# Patient Record
Sex: Male | Born: 1950 | Race: White | Hispanic: No
Health system: Southern US, Community
[De-identification: ages and names within clinical notes are randomized; demographics above are authoritative.]

## PROBLEM LIST (undated history)

## (undated) DIAGNOSIS — C3490 Malignant neoplasm of unspecified part of unspecified bronchus or lung: Secondary | ICD-10-CM

---

## 2006-07-03 ENCOUNTER — Ambulatory Visit: Payer: Self-pay | Admitting: Emergency Medicine

## 2006-07-04 ENCOUNTER — Ambulatory Visit: Payer: Self-pay | Admitting: Emergency Medicine

## 2006-12-23 ENCOUNTER — Ambulatory Visit: Payer: Self-pay | Admitting: Internal Medicine

## 2008-04-23 IMAGING — CT CT HEAD WITHOUT CONTRAST
1 series · 16 of 30 positions shown, 20 images · non-contrast
Comparison: none

REASON FOR EXAM: New Onset HA Worsening
COMMENTS:

PROCEDURE:     TANEYA - ASHE COVA WITHOUT CONTRAST  - July 04, 2006  [DATE]
RESULT:
HISTORY: New onset headache.
COMPARISON STUDIES: No recent.
PROCEDURE AND FINDINGS: No intra-axial or extra-axial pathologic fluid or
blood collections identified.  No mass lesion is noted. There is no
hydrocephalus.  No acute bony abnormalities identified.

[Series 2: soft tissue · axial · 0.43mm/px · z∈[-184,-44]mm · 16 of 32 slices shown, 20 images]
[im 2/32  brain]
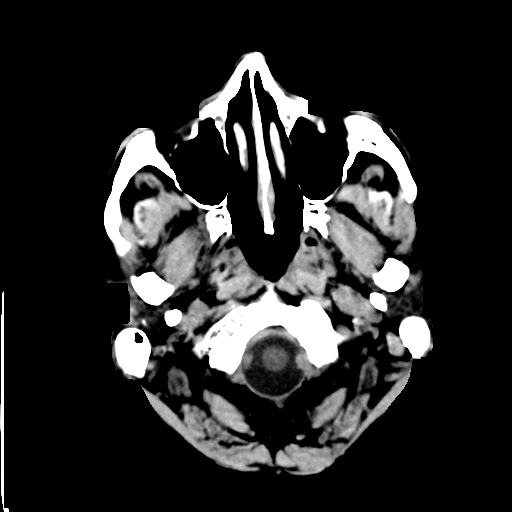
[im 2/32  bone]
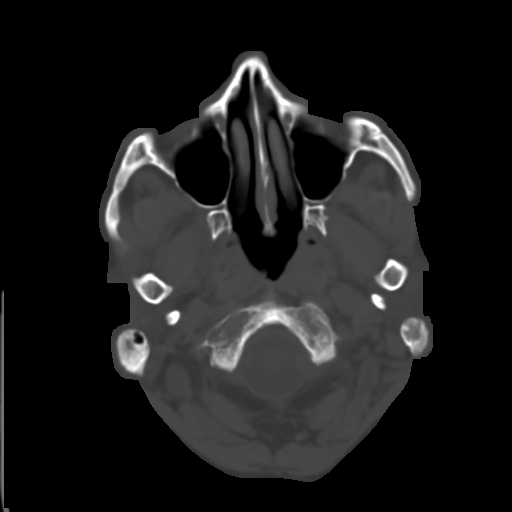
[im 4/32  brain]
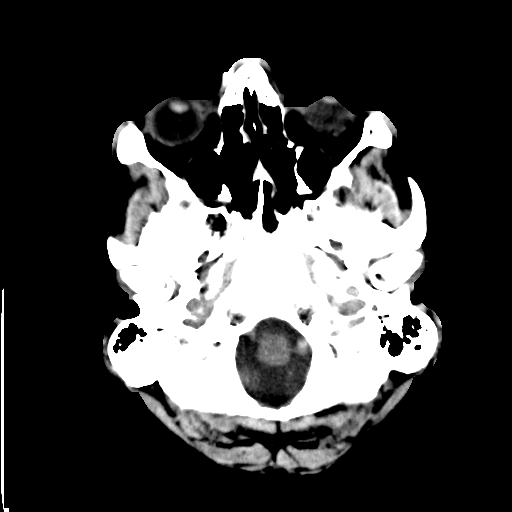
[im 6/32  brain]
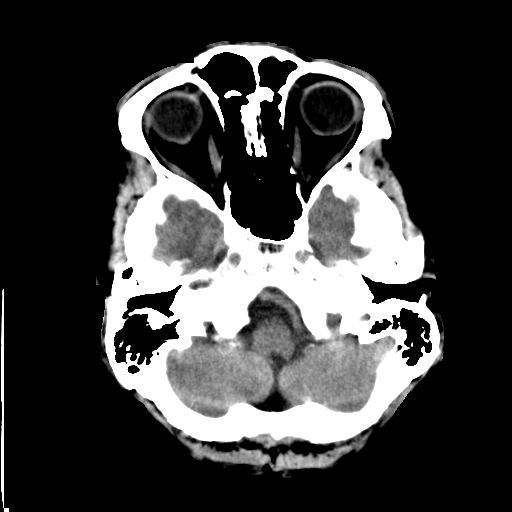
[im 8/32  brain]
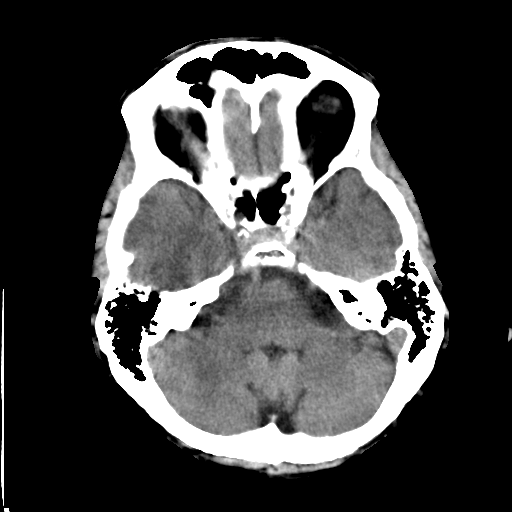
[im 9/32  brain]
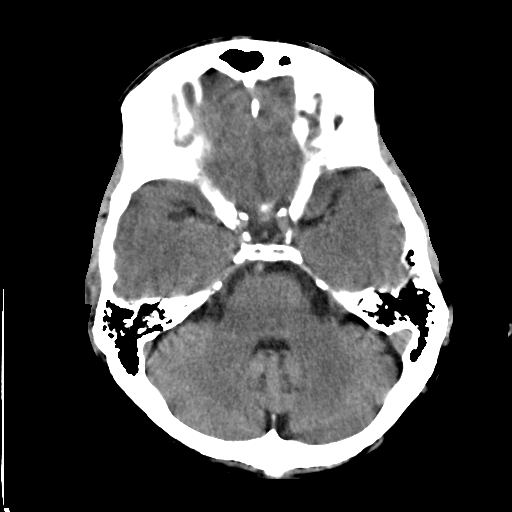
[im 9/32  bone]
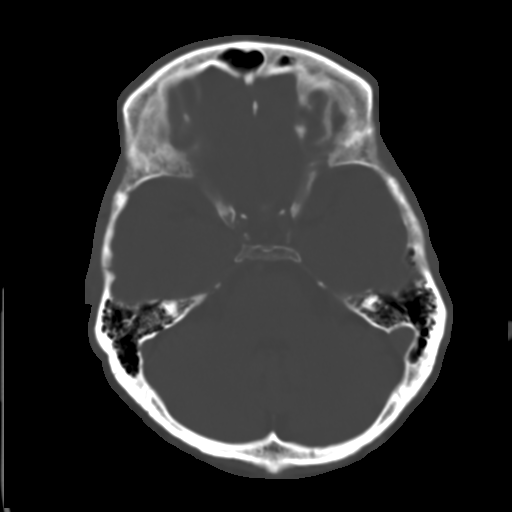
[im 11/32  brain]
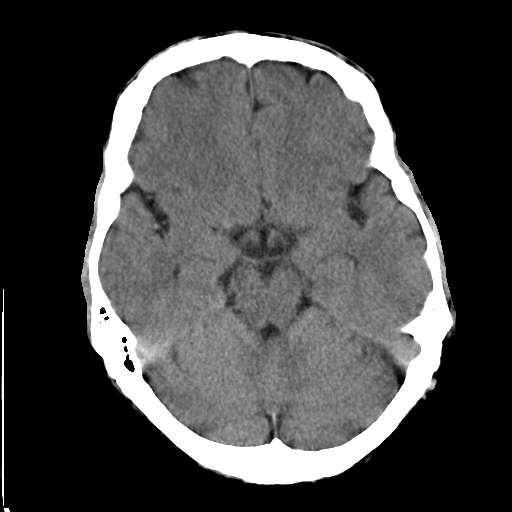
[im 13/32  brain]
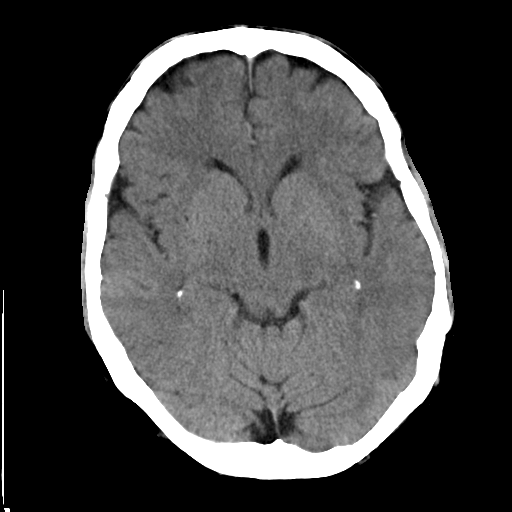
[im 15/32  brain]
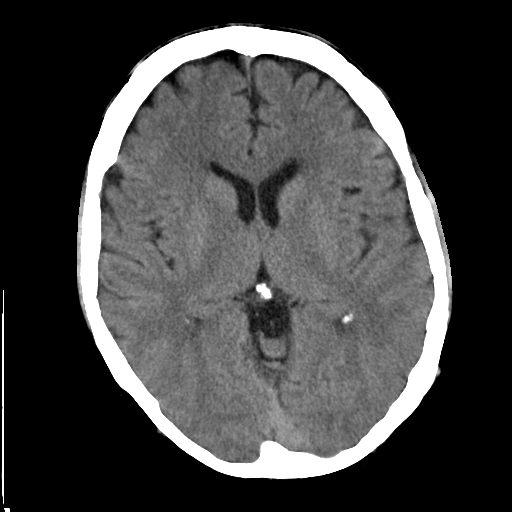
[im 17/32  brain]
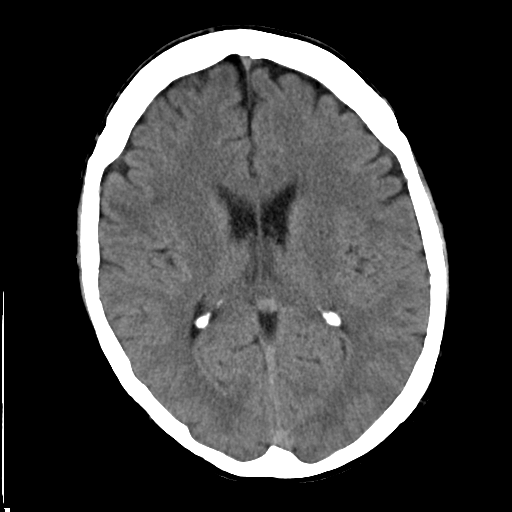
[im 17/32  bone]
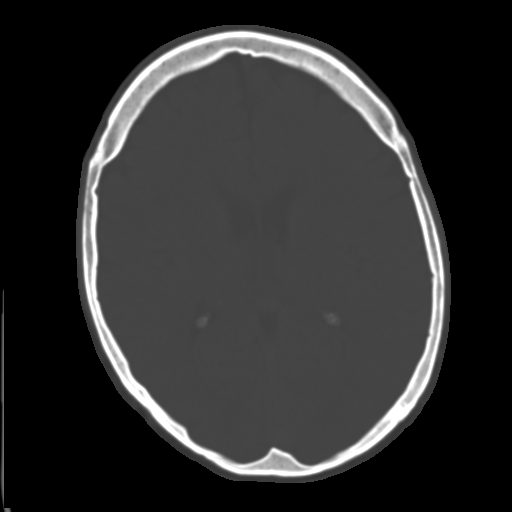
[im 19/32  brain]
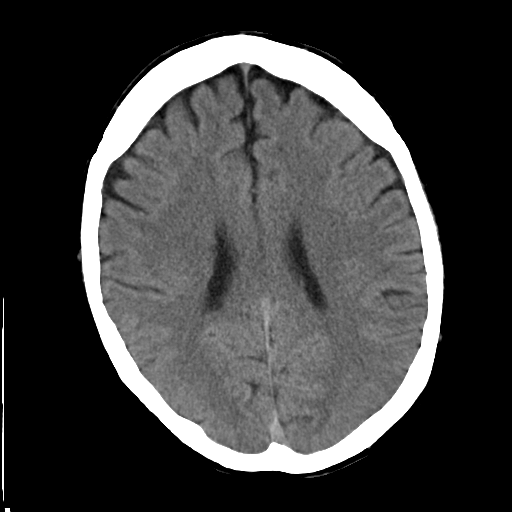
[im 21/32  brain]
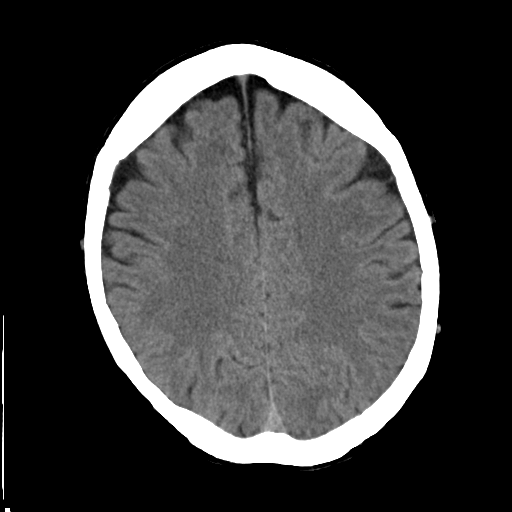
[im 23/32  brain]
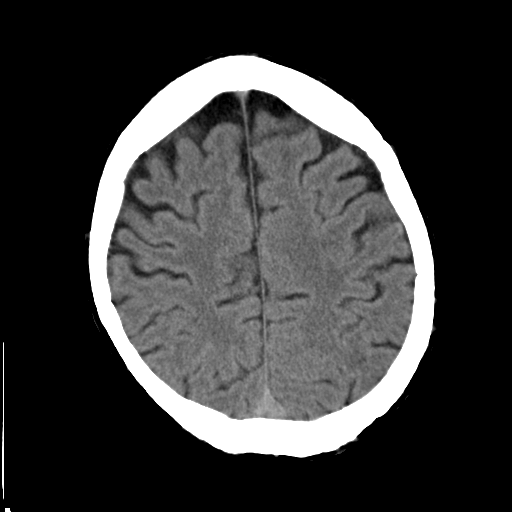
[im 24/32  brain]
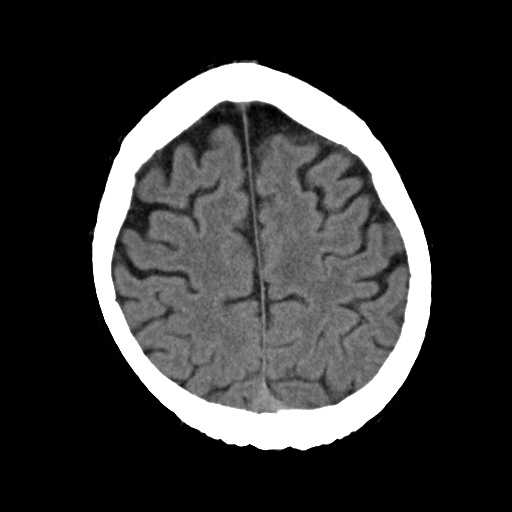
[im 24/32  bone]
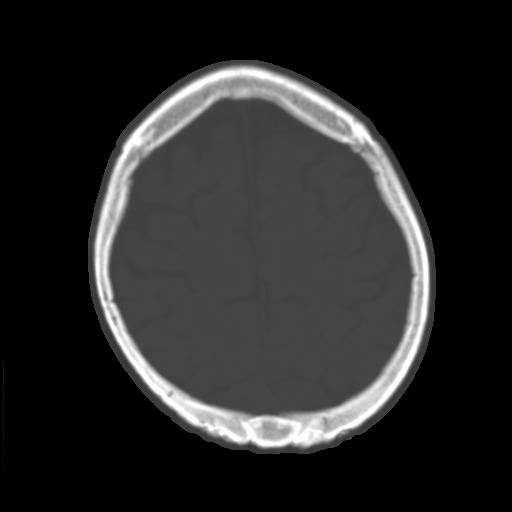
[im 26/32  brain]
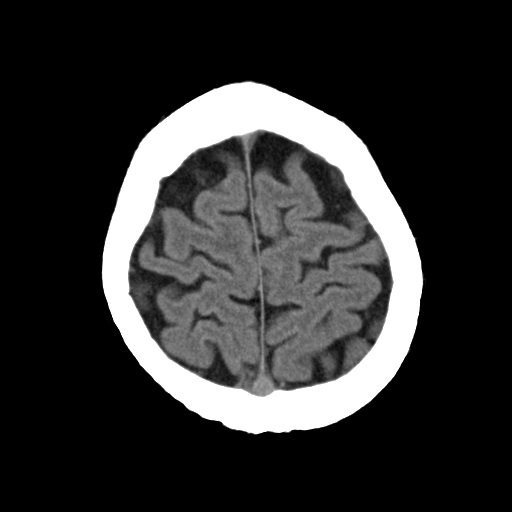
[im 28/32  brain]
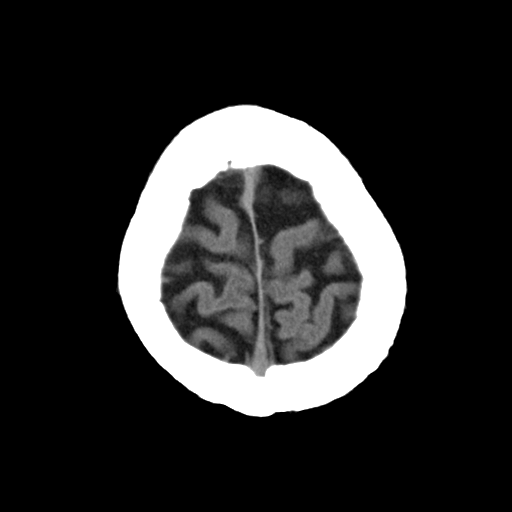
[im 30/32  brain]
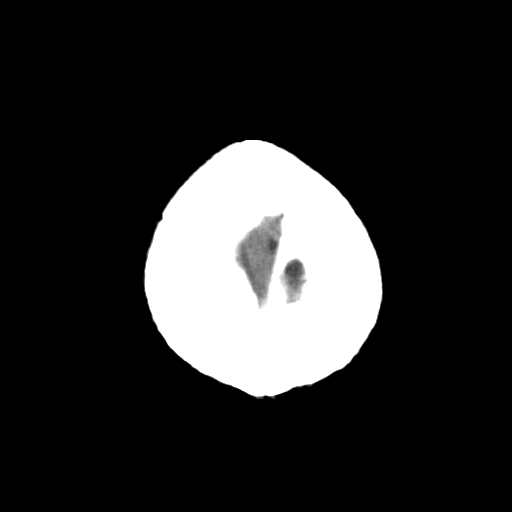

[16 of 30 positions shown; findings below may reference images not displayed]

IMPRESSION: 1)No acute intracranial abnormalities identified.

## 2009-04-01 ENCOUNTER — Ambulatory Visit: Payer: Self-pay | Admitting: Internal Medicine

## 2009-09-10 ENCOUNTER — Ambulatory Visit: Payer: Self-pay | Admitting: Internal Medicine

## 2010-08-05 ENCOUNTER — Ambulatory Visit: Payer: Self-pay | Admitting: Internal Medicine

## 2011-07-01 IMAGING — CR DG RIBS 2V*R*
1 series · 4 of 4 positions shown · non-contrast
Comparison: none

REASON FOR EXAM: Fell hitting ribs
COMMENTS:

PROCEDURE:     MDR - MDR RIBS RIGHT UNLILATERAL  - September 10, 2009  [DATE]
RESULT:     Comparison: None.

[Series 1: view not recorded · 0.17mm/px · 4 of 4 slices shown]
[im 1/4]
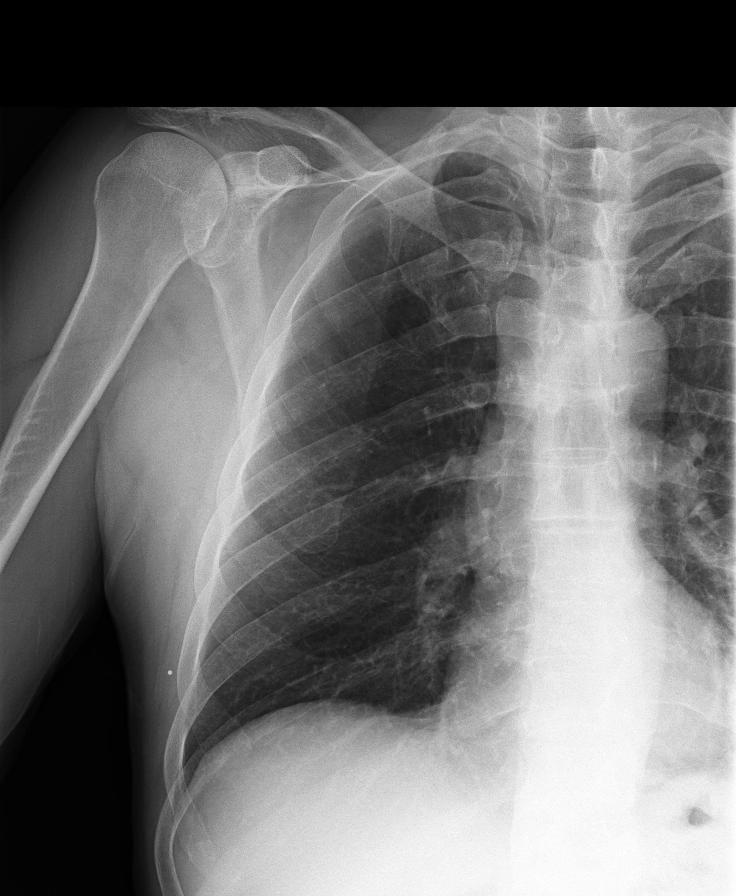
[im 2/4]
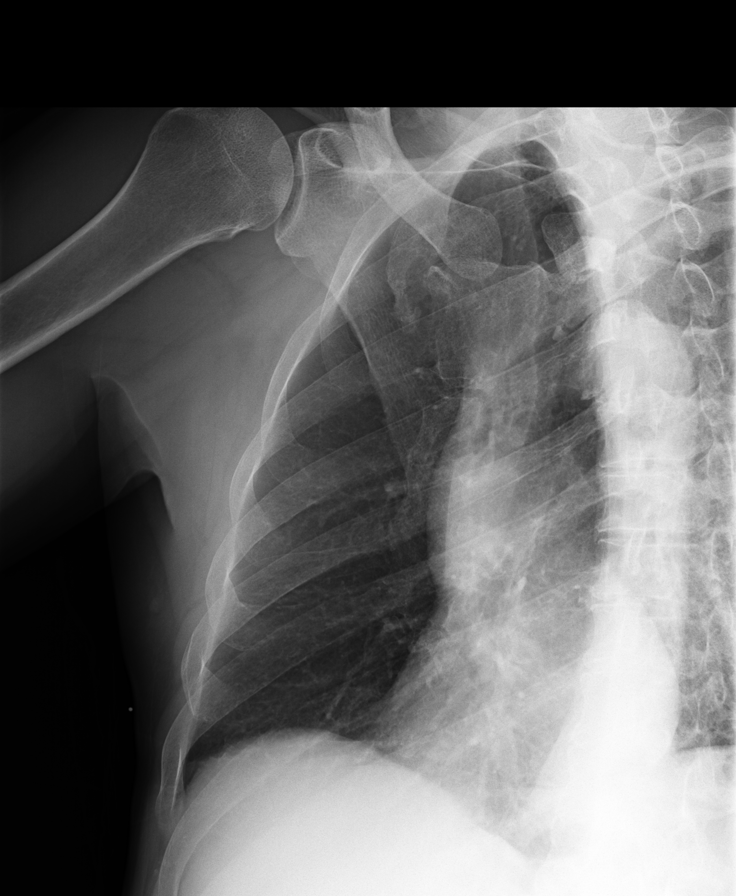
[im 3/4]
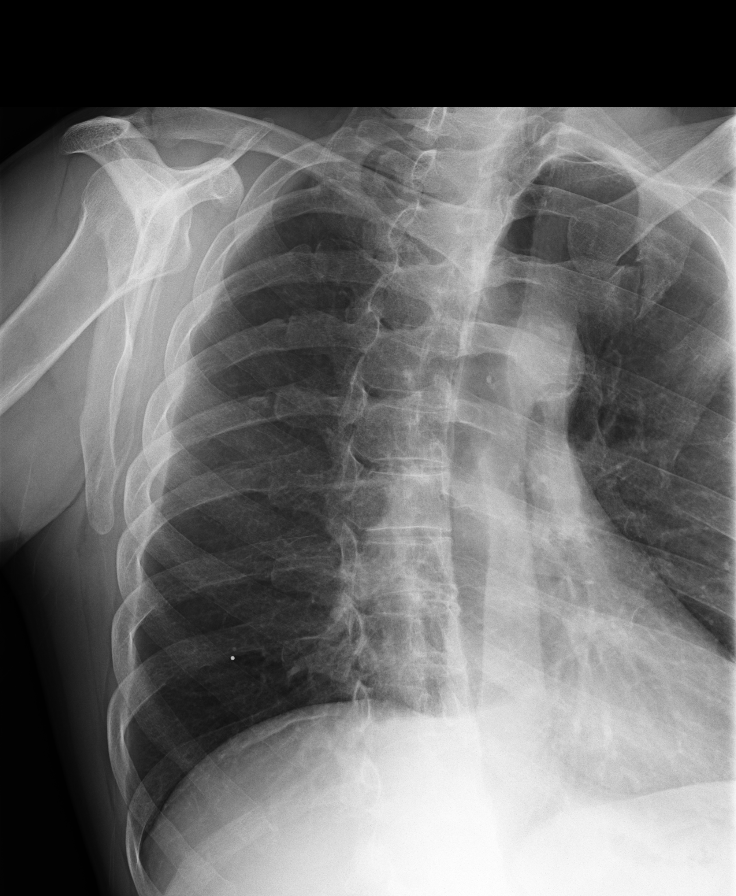
[im 4/4]
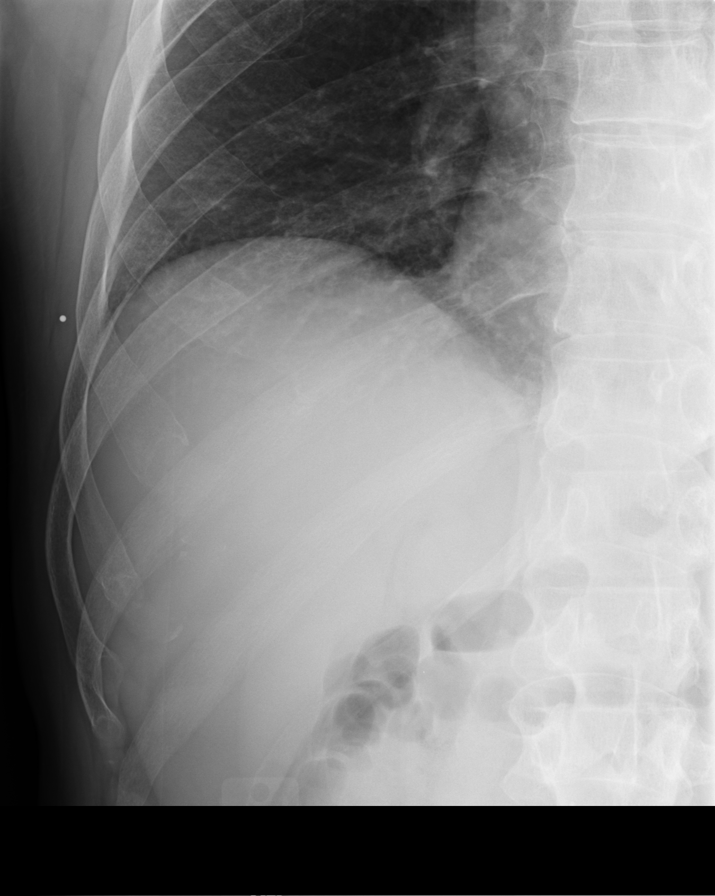

[4 of 4 positions shown; findings below may reference images not displayed]

FINDINGS: No displaced rib fractures seen. No pneumothorax. No pleural effusion.
IMPRESSION: No displaced rib fracture.

## 2019-09-27 ENCOUNTER — Telehealth: Payer: Self-pay

## 2019-09-27 NOTE — Telephone Encounter (Signed)
lmov to schedule  °

## 2019-09-27 NOTE — Telephone Encounter (Signed)
Referral authd from [facility] Dr. Barnabas Lister for Dyspnea  [facility] 08/30/19-12/28/19 [id number]

## 2019-09-30 NOTE — Telephone Encounter (Signed)
Bad numbers.  Unable to reach for scheduling .      Faxed back to [place] at [facility] to notify and closing referral.

## 2019-09-30 NOTE — Telephone Encounter (Signed)
Attempted to schedule no ans no vm  

## 2019-10-22 ENCOUNTER — Other Ambulatory Visit: Payer: Self-pay | Admitting: Internal Medicine

## 2019-10-22 DIAGNOSIS — R079 Chest pain, unspecified: Secondary | ICD-10-CM

## 2019-10-22 DIAGNOSIS — R0600 Dyspnea, unspecified: Secondary | ICD-10-CM

## 2019-10-26 ENCOUNTER — Other Ambulatory Visit: Payer: Self-pay

## 2019-10-26 ENCOUNTER — Ambulatory Visit (INDEPENDENT_AMBULATORY_CARE_PROVIDER_SITE_OTHER): Payer: No Typology Code available for payment source

## 2019-10-26 DIAGNOSIS — R079 Chest pain, unspecified: Secondary | ICD-10-CM | POA: Diagnosis not present

## 2019-10-26 DIAGNOSIS — R0600 Dyspnea, unspecified: Secondary | ICD-10-CM | POA: Diagnosis not present

## 2019-10-26 LAB — ECHOCARDIOGRAM COMPLETE
AR max vel: 3.58 cm2
AV Area VTI: 4.52 cm2
AV Area mean vel: 3.87 cm2
AV Mean grad: 2 mmHg
AV Peak grad: 3.5 mmHg
Ao pk vel: 0.93 m/s
Area-P 1/2: 2.37 cm2
Calc EF: 48.5 %
S' Lateral: 3.8 cm
Single Plane A2C EF: 53.3 %
Single Plane A4C EF: 44.2 %

## 2023-09-25 ENCOUNTER — Other Ambulatory Visit: Payer: Self-pay

## 2023-09-25 DIAGNOSIS — Z8501 Personal history of malignant neoplasm of esophagus: Secondary | ICD-10-CM

## 2023-09-25 DIAGNOSIS — C3490 Malignant neoplasm of unspecified part of unspecified bronchus or lung: Secondary | ICD-10-CM

## 2023-10-03 ENCOUNTER — Ambulatory Visit
Admission: RE | Admit: 2023-10-03 | Discharge: 2023-10-03 | Disposition: A | Discharge status: Home / Self Care | Source: Ambulatory Visit

## 2023-10-03 DIAGNOSIS — J439 Emphysema, unspecified: Secondary | ICD-10-CM | POA: Diagnosis not present

## 2023-10-03 DIAGNOSIS — I700 Atherosclerosis of aorta: Secondary | ICD-10-CM | POA: Insufficient documentation

## 2023-10-03 DIAGNOSIS — R918 Other nonspecific abnormal finding of lung field: Secondary | ICD-10-CM | POA: Diagnosis not present

## 2023-10-03 DIAGNOSIS — Z8501 Personal history of malignant neoplasm of esophagus: Secondary | ICD-10-CM | POA: Insufficient documentation

## 2023-10-03 DIAGNOSIS — C3490 Malignant neoplasm of unspecified part of unspecified bronchus or lung: Secondary | ICD-10-CM | POA: Diagnosis present

## 2023-10-03 DIAGNOSIS — N400 Enlarged prostate without lower urinary tract symptoms: Secondary | ICD-10-CM | POA: Insufficient documentation

## 2023-10-03 LAB — GLUCOSE, CAPILLARY: Glucose-Capillary: 73 mg/dL (ref 70–99)

## 2023-10-03 MED ORDER — FLUDEOXYGLUCOSE F - 18 (FDG) INJECTION
7.5700 | Freq: Once | INTRAVENOUS | Status: AC | PRN
  Administered 2023-10-03: 7.57 via INTRAVENOUS

## 2023-10-08 ENCOUNTER — Other Ambulatory Visit

## 2024-01-19 ENCOUNTER — Encounter: Payer: Self-pay | Admitting: Pulmonary Disease

## 2024-01-19 ENCOUNTER — Emergency Department

## 2024-01-19 ENCOUNTER — Emergency Department
Admission: EM | Admit: 2024-01-19 | Discharge: 2024-01-19 | Disposition: A | Discharge status: Home / Self Care | Attending: Emergency Medicine | Admitting: Emergency Medicine

## 2024-01-19 ENCOUNTER — Other Ambulatory Visit: Payer: Self-pay

## 2024-01-19 DIAGNOSIS — C3490 Malignant neoplasm of unspecified part of unspecified bronchus or lung: Secondary | ICD-10-CM

## 2024-01-19 DIAGNOSIS — Z951 Presence of aortocoronary bypass graft: Secondary | ICD-10-CM | POA: Diagnosis not present

## 2024-01-19 DIAGNOSIS — R1084 Generalized abdominal pain: Secondary | ICD-10-CM | POA: Diagnosis not present

## 2024-01-19 DIAGNOSIS — I2510 Atherosclerotic heart disease of native coronary artery without angina pectoris: Secondary | ICD-10-CM | POA: Insufficient documentation

## 2024-01-19 DIAGNOSIS — Z85118 Personal history of other malignant neoplasm of bronchus and lung: Secondary | ICD-10-CM | POA: Diagnosis not present

## 2024-01-19 DIAGNOSIS — R0602 Shortness of breath: Secondary | ICD-10-CM | POA: Insufficient documentation

## 2024-01-19 DIAGNOSIS — Z8501 Personal history of malignant neoplasm of esophagus: Secondary | ICD-10-CM | POA: Insufficient documentation

## 2024-01-19 DIAGNOSIS — R079 Chest pain, unspecified: Secondary | ICD-10-CM | POA: Insufficient documentation

## 2024-01-19 DIAGNOSIS — R0789 Other chest pain: Secondary | ICD-10-CM

## 2024-01-19 DIAGNOSIS — Z7982 Long term (current) use of aspirin: Secondary | ICD-10-CM | POA: Diagnosis not present

## 2024-01-19 DIAGNOSIS — R058 Other specified cough: Secondary | ICD-10-CM | POA: Insufficient documentation

## 2024-01-19 DIAGNOSIS — Z720 Tobacco use: Secondary | ICD-10-CM | POA: Diagnosis not present

## 2024-01-19 HISTORY — DX: Malignant neoplasm of unspecified part of unspecified bronchus or lung: C34.90

## 2024-01-19 LAB — RESP PANEL BY RT-PCR (RSV, FLU A&B, COVID)  RVPGX2
Influenza A by PCR: NEGATIVE
Influenza B by PCR: NEGATIVE
Resp Syncytial Virus by PCR: NEGATIVE
SARS Coronavirus 2 by RT PCR: NEGATIVE

## 2024-01-19 LAB — CBC WITH DIFFERENTIAL/PLATELET
Abs Immature Granulocytes: 0.03 K/uL (ref 0.00–0.07)
Basophils Absolute: 0.1 K/uL (ref 0.0–0.1)
Basophils Relative: 1 %
Eosinophils Absolute: 0.4 K/uL (ref 0.0–0.5)
Eosinophils Relative: 4 %
HCT: 46.2 % (ref 39.0–52.0)
Hemoglobin: 15.5 g/dL (ref 13.0–17.0)
Immature Granulocytes: 0 %
Lymphocytes Relative: 29 %
Lymphs Abs: 2.7 K/uL (ref 0.7–4.0)
MCH: 30.8 pg (ref 26.0–34.0)
MCHC: 33.5 g/dL (ref 30.0–36.0)
MCV: 91.8 fL (ref 80.0–100.0)
Monocytes Absolute: 0.7 K/uL (ref 0.1–1.0)
Monocytes Relative: 7 %
Neutro Abs: 5.3 K/uL (ref 1.7–7.7)
Neutrophils Relative %: 59 %
Platelets: 230 K/uL (ref 150–400)
RBC: 5.03 MIL/uL (ref 4.22–5.81)
RDW: 14.2 % (ref 11.5–15.5)
WBC: 9.2 K/uL (ref 4.0–10.5)
nRBC: 0 % (ref 0.0–0.2)

## 2024-01-19 LAB — URINALYSIS, ROUTINE W REFLEX MICROSCOPIC
Bilirubin Urine: NEGATIVE
Glucose, UA: NEGATIVE mg/dL
Hgb urine dipstick: NEGATIVE
Ketones, ur: NEGATIVE mg/dL
Leukocytes,Ua: NEGATIVE
Nitrite: NEGATIVE
Protein, ur: NEGATIVE mg/dL
Specific Gravity, Urine: 1.005 (ref 1.005–1.030)
pH: 6 (ref 5.0–8.0)

## 2024-01-19 LAB — COMPREHENSIVE METABOLIC PANEL WITH GFR
ALT: 9 U/L (ref 0–44)
AST: 19 U/L (ref 15–41)
Albumin: 4.3 g/dL (ref 3.5–5.0)
Alkaline Phosphatase: 106 U/L (ref 38–126)
Anion gap: 9 (ref 5–15)
BUN: 18 mg/dL (ref 8–23)
CO2: 27 mmol/L (ref 22–32)
Calcium: 9.7 mg/dL (ref 8.9–10.3)
Chloride: 105 mmol/L (ref 98–111)
Creatinine, Ser: 0.83 mg/dL (ref 0.61–1.24)
GFR, Estimated: >60 mL/min
Glucose, Bld: 78 mg/dL (ref 70–99)
Potassium: 4.4 mmol/L (ref 3.5–5.1)
Sodium: 141 mmol/L (ref 135–145)
Total Bilirubin: 0.3 mg/dL (ref 0.0–1.2)
Total Protein: 7.4 g/dL (ref 6.5–8.1)

## 2024-01-19 LAB — LIPASE, BLOOD: Lipase: 38 U/L (ref 11–51)

## 2024-01-19 LAB — TROPONIN T, HIGH SENSITIVITY
Troponin T High Sensitivity: <15 ng/L (ref 0–19)
Troponin T High Sensitivity: <15 ng/L (ref 0–19)

## 2024-01-19 MED ORDER — HYDROMORPHONE HCL 1 MG/ML IJ SOLN
0.5000 mg | Freq: Once | INTRAMUSCULAR | Status: AC
  Administered 2024-01-19: 0.5 mg via INTRAVENOUS
  Filled 2024-01-19: qty 0.5

## 2024-01-19 MED ORDER — IOHEXOL 350 MG/ML SOLN
100.0000 mL | Freq: Once | INTRAVENOUS | Status: AC | PRN
  Administered 2024-01-19: 100 mL via INTRAVENOUS

## 2024-01-19 NOTE — ED Triage Notes (Signed)
 Pt report SOB and centralized sharp CP over the past 3-4 days. Associated clear productive cough. Current smoker. No chills/fevers. No known sick exposure.

## 2024-01-19 NOTE — ED Notes (Signed)
 Pt expressed that he wants to leave and requested to speak with doctor. MD notified. Dr. Dicky at beside

## 2024-01-19 NOTE — ED Provider Notes (Signed)
 Clinical Course as of 01/19/24 1058  Mon Jan 19, 2024  1053 CT imaging negative for PE.  Unruptured ascending aortic aneurysm.  Will have patient [facility] with [facility].  Additionally multiple other incidental type findings that appear to be chronic. [MQ]    Clinical Course User Index [MQ] Isaac Anes, MD     CT Angio Chest PE W and/or Wo Contrast Result Date: 01/19/2024 CLINICAL DATA:  Shortness of breath with sharp chest pain over the last 3-4 days. Abdominal pain, generalized. Personal history of esophageal cancer EXAM: CT ANGIOGRAPHY CHEST CT ABDOMEN AND PELVIS WITH CONTRAST TECHNIQUE: Multidetector CT imaging of the chest was performed using the standard protocol during bolus administration of intravenous contrast. Multiplanar CT image reconstructions and MIPs were obtained to evaluate the vascular anatomy. Multidetector CT imaging of the abdomen and pelvis was performed using the standard protocol during bolus administration of intravenous contrast. RADIATION DOSE REDUCTION: This exam was performed according to the departmental dose-optimization program which includes automated exposure control, adjustment of the mA and/or kV according to patient size and/or use of iterative reconstruction technique. CONTRAST:  OMNIPAQUE  IOHEXOL  350 MG/ML SOLN COMPARISON:  PET-CT 10/03/2023. FINDINGS: CTA CHEST FINDINGS Cardiovascular: The heart size is upper normal to borderline enlarged. No substantial pericardial effusion. Ascending thoracic aorta measures 4.0 cm diameter. Moderate atherosclerotic calcification is noted in the wall of the thoracic aorta. Coronary artery calcification is evident. There is no filling defect within the opacified pulmonary arteries to suggest the presence of an acute pulmonary embolus. Mediastinum/Nodes: No mediastinal lymphadenopathy. 11 mm short axis right hilar node is upper normal to borderline enlarged. No left hilar lymphadenopathy. The esophagus has normal imaging  features. There is no axillary lymphadenopathy. Lungs/Pleura: Surgical changes noted left lung. Nodular consolidative opacity in the anterior left upper lung on 35/5 is similar to prior. This shows no hypermetabolism on previous PET imaging. 5 mm anterior right lung nodule on 66/5 is stable since prior. Irregular atypical cyst/cavitary lesion right middle lobe on 104/5 is not substantially changed in the interval. This showed low level activity on previous PET imaging. Dependent atelectasis noted in both lower lungs. Underlying changes of centrilobular and paraseptal emphysema evident. Musculoskeletal: No worrisome lytic or sclerotic osseous abnormality. Review of the MIP images confirms the above findings. CT ABDOMEN and PELVIS FINDINGS Hepatobiliary: 11 mm subcapsular hypodensity in the inferior right liver along the gallbladder fossa was probably present on the previous study but not well demonstrated on the noncontrast exam. Lesion is indeterminate on today's study it cannot be definitively characterized but is likely benign. Gallbladder is surgically absent. No intrahepatic or extrahepatic biliary dilation. Pancreas: No focal mass lesion. No dilatation of the main duct. No intraparenchymal cyst. No peripancreatic edema. Spleen: No splenomegaly. No suspicious focal mass lesion. Adrenals/Urinary Tract: 2.3 cm benign right adrenal adenoma is stable. Nodular thickening of the left adrenal gland is stable since prior. Cortical scarring noted right kidney Tiny well-defined homogeneous low-density lesions in both kidneys are too small to characterize but are statistically most likely benign and probably cysts. No followup imaging is recommended. No evidence for hydroureter. The urinary bladder appears normal for the degree of distention. Stomach/Bowel: Stomach is unremarkable. No gastric wall thickening. No evidence of outlet obstruction. Duodenum is normally positioned as is the ligament of Treitz. Duodenal  diverticulum noted. No small bowel wall thickening. No small bowel dilatation. The terminal ileum is normal. The appendix is normal. No gross colonic mass. No colonic wall thickening. Diverticular changes are noted  in the left colon without evidence of diverticulitis. Vascular/Lymphatic: There is advanced atherosclerotic calcification of the abdominal aorta without aneurysm. There is no gastrohepatic or hepatoduodenal ligament lymphadenopathy. No retroperitoneal or mesenteric lymphadenopathy. 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 Multifocal stenosis in the left external iliac artery is likely flow limiting. Concern for flow limiting stenosis in the distal right external iliac artery. Reproductive: Prostate gland is enlarged. Other: No substantial intraperitoneal free fluid. Musculoskeletal: Intramedullary nail in the right femur has been incompletely visualized. No worrisome lytic or sclerotic osseous abnormality. Review of the MIP images confirms the above findings. IMPRESSION: 1. No CT evidence for acute pulmonary embolus. 2. 4.0 cm diameter ascending thoracic aortic aneurysm. Recommend annual imaging followup by CTA or MRA. This recommendation follows 2010 ACCF/AHA/AATS/ACR/ASA/SCA/SCAI/SIR/STS/SVM Guidelines for the Diagnosis and Management of Patients with Thoracic Aortic Disease. Circulation. 2010; 121: Z733-z630. Aortic aneurysm NOS (ICD10-I71.9) 3. Stable appearance of the nodular consolidative opacity in the anterior left upper lung. This showed no hypermetabolism on previous PET imaging. Continued follow-up warranted. 4. Stable 5 mm anterior right lung nodule. 5. Irregular atypical cyst/cavitary lesion right middle lobe is not substantially changed in the interval. This showed low level activity on previous PET imaging. Attention on follow-up imaging recommended. 6. 11 mm subcapsular hypodensity in the inferior right liver along the  gallbladder fossa was probably present on the previous study but not well demonstrated on that noncontrast exam. Lesion is indeterminate on today's study and while it cannot be definitively characterized, is likely benign. Attention on follow-up recommended. 7. Stable benign right adrenal adenoma with stable nodular thickening of the left adrenal gland. 8. Chronic occlusion of the left common iliac artery with some reconstitution of the external iliac artery. Multifocal stenosis in the left external iliac artery is likely flow limiting. Concern for flow limiting stenosis in the distal right external iliac artery. 9. Aortic Atherosclerosis (ICD10-I70.0) and Emphysema (ICD10-J43.9). Electronically Signed   By: Camellia Candle M.D.   On: 01/19/2024 08:03   CT ABDOMEN PELVIS W CONTRAST Result Date: 01/19/2024 CLINICAL DATA:  Shortness of breath with sharp chest pain over the last 3-4 days. Abdominal pain, generalized. Personal history of esophageal cancer EXAM: CT ANGIOGRAPHY CHEST CT ABDOMEN AND PELVIS WITH CONTRAST TECHNIQUE: Multidetector CT imaging of the chest was performed using the standard protocol during bolus administration of intravenous contrast. Multiplanar CT image reconstructions and MIPs were obtained to evaluate the vascular anatomy. Multidetector CT imaging of the abdomen and pelvis was performed using the standard protocol during bolus administration of intravenous contrast. RADIATION DOSE REDUCTION: This exam was performed according to the departmental dose-optimization program which includes automated exposure control, adjustment of the mA and/or kV according to patient size and/or use of iterative reconstruction technique. CONTRAST:  OMNIPAQUE  IOHEXOL  350 MG/ML SOLN COMPARISON:  PET-CT 10/03/2023. FINDINGS: CTA CHEST FINDINGS Cardiovascular: The heart size is upper normal to borderline enlarged. No substantial pericardial effusion. Ascending thoracic aorta measures 4.0 cm diameter. Moderate  atherosclerotic calcification is noted in the wall of the thoracic aorta. Coronary artery calcification is evident. There is no filling defect within the opacified pulmonary arteries to suggest the presence of an acute pulmonary embolus. Mediastinum/Nodes: No mediastinal lymphadenopathy. 11 mm short axis right hilar node is upper normal to borderline enlarged. No left hilar lymphadenopathy. The esophagus has normal imaging features. There is no axillary lymphadenopathy. Lungs/Pleura: Surgical changes noted left lung. Nodular consolidative opacity in the anterior left upper  lung on 35/5 is similar to prior. This shows no hypermetabolism on previous PET imaging. 5 mm anterior right lung nodule on 66/5 is stable since prior. Irregular atypical cyst/cavitary lesion right middle lobe on 104/5 is not substantially changed in the interval. This showed low level activity on previous PET imaging. Dependent atelectasis noted in both lower lungs. Underlying changes of centrilobular and paraseptal emphysema evident. Musculoskeletal: No worrisome lytic or sclerotic osseous abnormality. Review of the MIP images confirms the above findings. CT ABDOMEN and PELVIS FINDINGS Hepatobiliary: 11 mm subcapsular hypodensity in the inferior right liver along the gallbladder fossa was probably present on the previous study but not well demonstrated on the noncontrast exam. Lesion is indeterminate on today's study it cannot be definitively characterized but is likely benign. Gallbladder is surgically absent. No intrahepatic or extrahepatic biliary dilation. Pancreas: No focal mass lesion. No dilatation of the main duct. No intraparenchymal cyst. No peripancreatic edema. Spleen: No splenomegaly. No suspicious focal mass lesion. Adrenals/Urinary Tract: 2.3 cm benign right adrenal adenoma is stable. Nodular thickening of the left adrenal gland is stable since prior. Cortical scarring noted right kidney Tiny well-defined homogeneous low-density  lesions in both kidneys are too small to characterize but are statistically most likely benign and probably cysts. No followup imaging is recommended. No evidence for hydroureter. The urinary bladder appears normal for the degree of distention. Stomach/Bowel: Stomach is unremarkable. No gastric wall thickening. No evidence of outlet obstruction. Duodenum is normally positioned as is the ligament of Treitz. Duodenal diverticulum noted. No small bowel wall thickening. No small bowel dilatation. The terminal ileum is normal. The appendix is normal. No gross colonic mass. No colonic wall thickening. Diverticular changes are noted in the left colon without evidence of diverticulitis. Vascular/Lymphatic: There is advanced atherosclerotic calcification of the abdominal aorta without aneurysm. There is no gastrohepatic or hepatoduodenal ligament lymphadenopathy. No retroperitoneal or mesenteric lymphadenopathy. 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 Multifocal stenosis in the left external iliac artery is likely flow limiting. Concern for flow limiting stenosis in the distal right external iliac artery. Reproductive: Prostate gland is enlarged. Other: No substantial intraperitoneal free fluid. Musculoskeletal: Intramedullary nail in the right femur has been incompletely visualized. No worrisome lytic or sclerotic osseous abnormality. Review of the MIP images confirms the above findings. IMPRESSION: 1. No CT evidence for acute pulmonary embolus. 2. 4.0 cm diameter ascending thoracic aortic aneurysm. Recommend annual imaging followup by CTA or MRA. This recommendation follows 2010 ACCF/AHA/AATS/ACR/ASA/SCA/SCAI/SIR/STS/SVM Guidelines for the Diagnosis and Management of Patients with Thoracic Aortic Disease. Circulation. 2010; 121: [id number]. Aortic aneurysm NOS (ICD10-I71.9) 3. Stable appearance of the nodular consolidative opacity in the anterior left  upper lung. This showed no hypermetabolism on previous PET imaging. Continued follow-up warranted. 4. Stable 5 mm anterior right lung nodule. 5. Irregular atypical cyst/cavitary lesion right middle lobe is not substantially changed in the interval. This showed low level activity on previous PET imaging. Attention on follow-up imaging recommended. 6. 11 mm subcapsular hypodensity in the inferior right liver along the gallbladder fossa was probably present on the previous study but not well demonstrated on that noncontrast exam. Lesion is indeterminate on today's study and while it cannot be definitively characterized, is likely benign. Attention on follow-up recommended. 7. Stable benign right adrenal adenoma with stable nodular thickening of the left adrenal gland. 8. Chronic occlusion of the left common iliac artery with some reconstitution of the external iliac artery. Multifocal stenosis  in the left external iliac artery is likely flow limiting. Concern for flow limiting stenosis in the distal right external iliac artery. 9. Aortic Atherosclerosis (ICD10-I70.0) and Emphysema (ICD10-J43.9). Electronically Signed   By: Camellia Candle M.D.   On: 01/19/2024 08:03   DG Chest Portable 1 View Result Date: 01/19/2024 CLINICAL DATA:  Shortness of breath. EXAM: PORTABLE CHEST 1 VIEW COMPARISON:  None Available. FINDINGS: The lungs are clear without focal pneumonia, edema, pneumothorax or pleural effusion. Staple line noted left parahilar lung. The cardiopericardial silhouette is within normal limits for size. No acute bony abnormality. IMPRESSION: No active disease. Electronically Signed   By: Camellia Candle M.D.   On: 01/19/2024 05:39     Discussed with patient his CT findings.  He reports he does not want [facility]  He would like to see if he could get into a primary care here and I discussed with him our pulmonary clinic, nodule clinic, placed a referral to primary care establishment  team, and also vascular surgery.  Multiple follow-ups recommended.  He reports that he is made the decision in the past and holding to the decision not to continue with medications or and reports he was not and has declined chemotherapy and treatments for cancer in the past.  He is interested however in primary care we discussed multiple findings careful return precautions as relates to chest pain dyspnea  I encouraged him to follow-up with primary care schedule appointment answer his phone and set up follow-up with the clinics as recommended.  He is agreeable with this.  He is awake alert no distress pleasant stable hemodynamics appropriate for discharge   Return precautions and treatment recommendations and follow-up discussed with the patient who is agreeable with the plan.    Isaac Anes, MD 01/19/24 1100

## 2024-01-19 NOTE — ED Provider Notes (Signed)
 "  [facility] Provider Note    Event Date/Time   First MD Initiated Contact with Patient 01/19/24 [phone]     (approximate)   History   Shortness of Breath and Chest Pain   HPI  Isaac Perez is a 74 y.o. male with history of NSTEMI, on aspirin who comes in with shortness of breath and chest pain.  Patient follows at the [facility] [place].  I reviewed a note from [facility] cardiology on 12/11/2021. 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  Patient was found to have reduced EF of 40%).  Patient comes in with centralized chest pain over the past 3 to 4 days.  He does report a clear productive cough.  Patient reports has not been taking any of his medications for the past 6 months.  He reports that he was told that he has cancer and he does not want to be on any chemotherapy but they have not given him any pain medications other than Tylenol, ibuprofen to try to help with his pain.  He reports chronic pain in his chest but reports that today he developed shortness of breath that was worse than normal.  He does report smoking.  He states that he has had a dry cough.  No known fevers no headaches no confusion no falls or hitting his head or other concerns.  Patient is also reports some generalized abdominal pain as well.     Physical Exam   Triage Vital Signs: ED Triage Vitals [01/19/24 0434]  Encounter Vitals Group     BP (!) 167/118     Girls Systolic BP Percentile      Girls Diastolic BP Percentile      Boys Systolic BP Percentile      Boys Diastolic BP Percentile      Pulse Rate 90     Resp (!) 24     Temp (!) 96.9 F (36.1 C)     Temp Source Axillary     SpO2 100 %     Weight      Height      Head Circumference      Peak Flow      Pain Score      Pain Loc       Pain Education      Exclude from Growth Chart     Most recent vital signs: Vitals:   01/19/24 0434  BP: (!) 167/118  Pulse: 90  Resp: (!) 24  Temp: (!) 96.9 F (36.1 C)  SpO2: 100%     General: Awake, no distress.  CV:  Good peripheral perfusion.  Resp:  Normal effort.  Clear lungs Abd:  No distention.  Slight tenderness to his abdomen Other:  No swelling.  No calf tenderness   ED Results / Procedures / Treatments   Labs (all labs ordered are listed, but only abnormal results are displayed) Labs Reviewed - No data to display   EKG  My interpretation of EKG:  Patient has baseline tremors to difficult to interpret but appears to be sinus with occasional PVCs without any obvious ST elevation or T wave inversions  Repeat EKG again appears sinus with either artifact versus PVC without any evidence of ST elevation T wave inversions  RADIOLOGY I have reviewed the xray personally  and interpreted no evidence of any pneumonia   PROCEDURES:  Critical Care performed: No  Procedures   MEDICATIONS ORDERED IN ED: Medications - No data to display   IMPRESSION / MDM / ASSESSMENT AND PLAN / ED COURSE  I reviewed the triage vital signs and the nursing notes.   Patient's presentation is most consistent with acute presentation with potential threat to life or bodily function.   Patient comes in with shortness of breath, chest pain the setting of medication noncompliance.  Patient states he has no interest in restarting his medications.  Workup will be done to evaluate for ACS, dehydration, pneumonia.  If x-ray is negative will get CT PE and evaluate for pulmonary embolism as well as CT abdomen given he does report some new abdominal pain.  He denies any falls hitting his head headaches, confusion to suggest needing a CT of his head. Pt given iv dilaudid  x1 for pain.  Patient has no significant wheezing to suggest COPD exacerbation.  Lipase was normal CBC reassuring CMP  reassuring troponin was negative.  Patient be handed off to oncoming team pending CT imaging, repeat troponin and further disposition    The patient is on the cardiac monitor to evaluate for evidence of arrhythmia and/or significant heart rate changes.      FINAL CLINICAL IMPRESSION(S) / ED DIAGNOSES   Final diagnoses:  Shortness of breath     Rx / DC Orders   ED Discharge Orders     None        Note:  This document was prepared using Dragon voice recognition software and may include unintentional dictation errors.   Ernest Ronal BRAVO, MD 01/19/24 [phone]  "
